# Patient Record
Sex: Male | Born: 1975 | Race: White | Hispanic: No | Marital: Single | Smoking: Current every day smoker
Health system: Southern US, Community
[De-identification: ages and names within clinical notes are randomized; demographics above are authoritative.]

---

## 2004-12-06 ENCOUNTER — Emergency Department (HOSPITAL_COMMUNITY): Admission: EM | Admit: 2004-12-06 | Discharge: 2004-12-06 | Payer: Self-pay | Admitting: Emergency Medicine

## 2004-12-09 ENCOUNTER — Emergency Department (HOSPITAL_COMMUNITY): Admission: EM | Admit: 2004-12-09 | Discharge: 2004-12-09 | Payer: Self-pay | Admitting: Emergency Medicine

## 2007-11-26 ENCOUNTER — Emergency Department (HOSPITAL_COMMUNITY): Admission: EM | Admit: 2007-11-26 | Discharge: 2007-11-26 | Payer: Self-pay | Admitting: Emergency Medicine

## 2008-05-03 ENCOUNTER — Emergency Department (HOSPITAL_COMMUNITY): Admission: EM | Admit: 2008-05-03 | Discharge: 2008-05-03 | Payer: Self-pay | Admitting: Emergency Medicine

## 2008-11-04 ENCOUNTER — Emergency Department (HOSPITAL_COMMUNITY): Admission: EM | Admit: 2008-11-04 | Discharge: 2008-11-04 | Payer: Self-pay | Admitting: Emergency Medicine

## 2009-03-04 ENCOUNTER — Emergency Department (HOSPITAL_COMMUNITY): Admission: EM | Admit: 2009-03-04 | Discharge: 2009-03-04 | Payer: Self-pay | Admitting: Emergency Medicine

## 2009-03-04 ENCOUNTER — Inpatient Hospital Stay (HOSPITAL_COMMUNITY): Admission: AD | Admit: 2009-03-04 | Discharge: 2009-03-08 | Payer: Self-pay | Admitting: Psychiatry

## 2009-03-04 ENCOUNTER — Ambulatory Visit: Payer: Self-pay | Admitting: Psychiatry

## 2009-06-17 IMAGING — CR DG FINGER THUMB 2+V*R*
3 series · 3 of 3 positions shown · non-contrast
Comparison: None

CLINICAL DATA: Injury to right thumb.

RIGHT THUMB 2+V

[x finger obl. right]
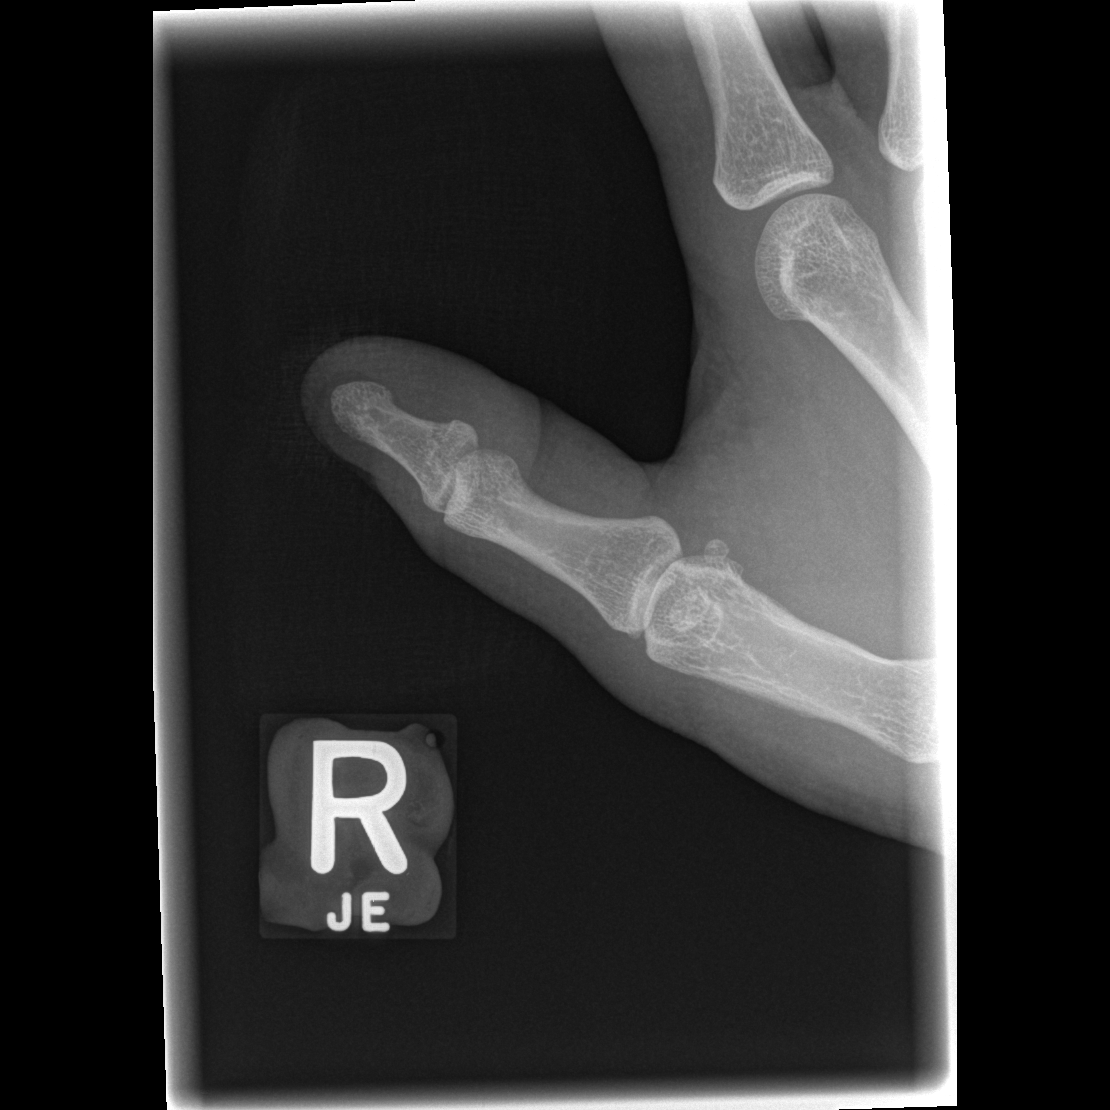

[x finger lateral right]
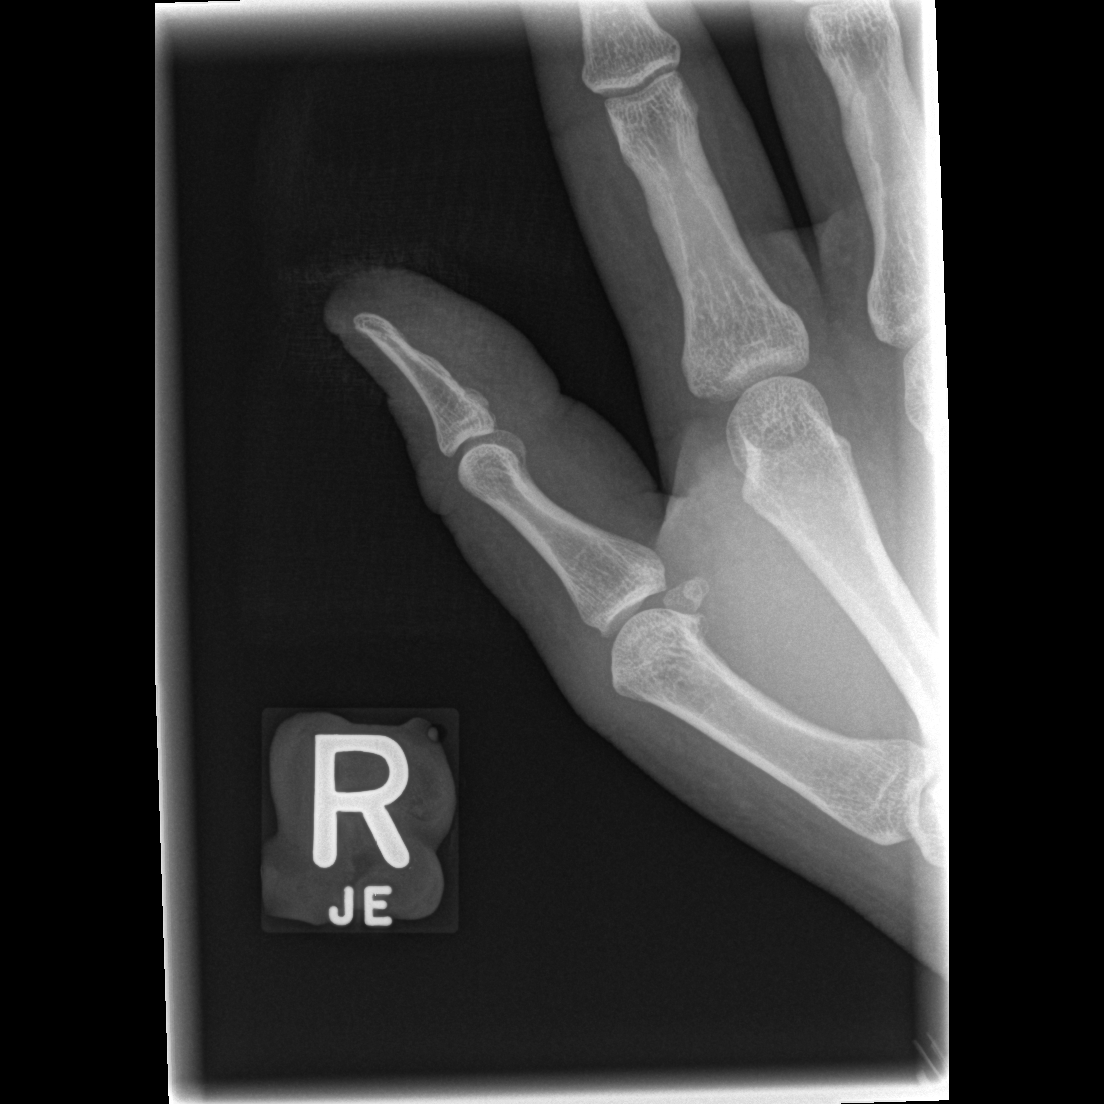

[x finger pa right]
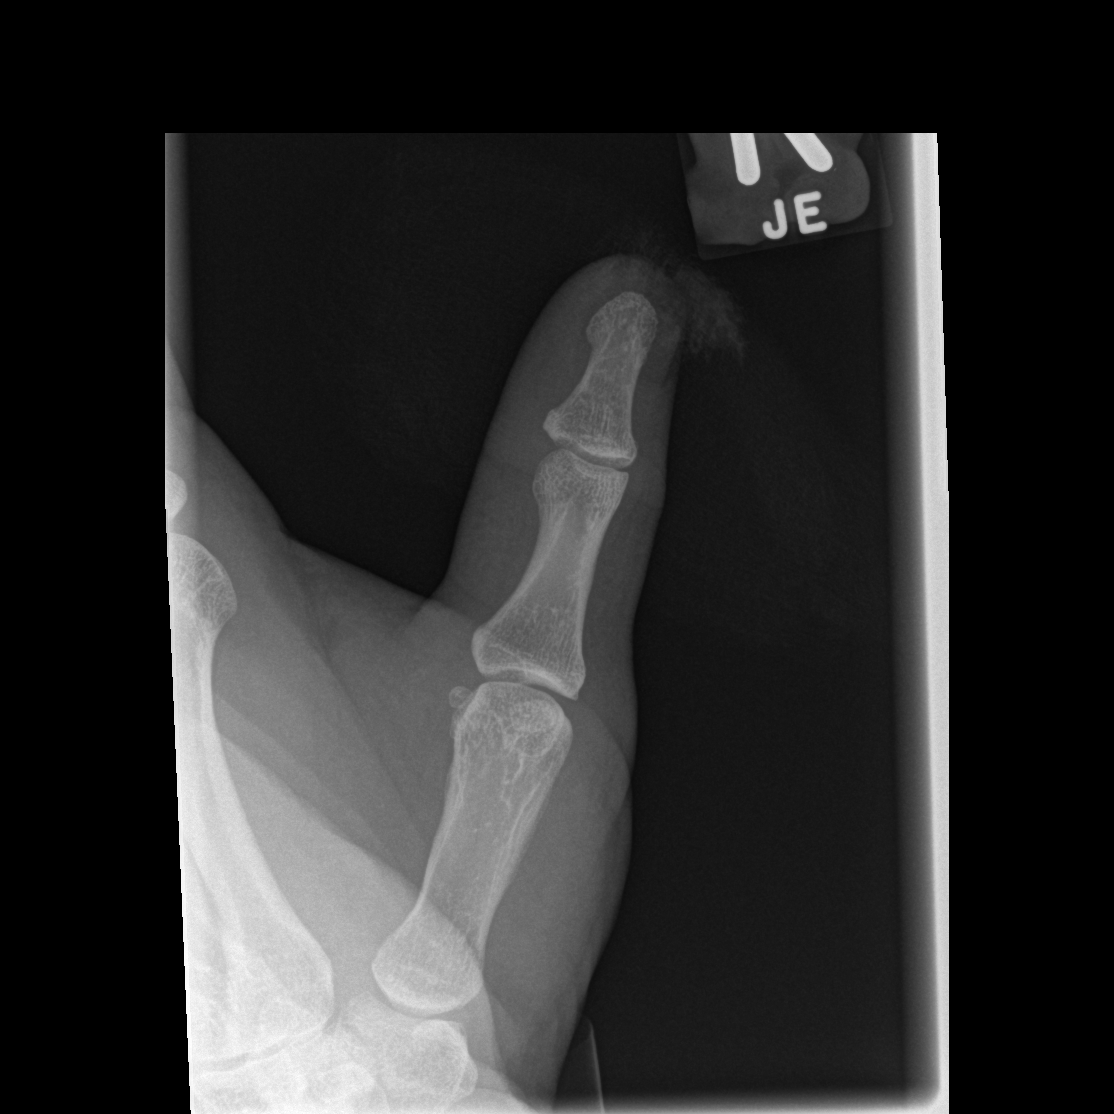

[3 of 3 positions shown; findings below may reference images not displayed]

FINDINGS: There is no evidence of acute bony abnormality.
There is no evidence of fracture, subluxation, or dislocation.
Soft tissue injury of the distal thumb tip is noted.
IMPRESSION: No evidence of acute bony abnormality.

## 2009-10-17 ENCOUNTER — Emergency Department (HOSPITAL_COMMUNITY): Admission: EM | Admit: 2009-10-17 | Discharge: 2009-10-18 | Payer: Self-pay | Admitting: Emergency Medicine

## 2009-10-18 ENCOUNTER — Ambulatory Visit: Payer: Self-pay | Admitting: Psychiatry

## 2009-10-18 ENCOUNTER — Inpatient Hospital Stay (HOSPITAL_COMMUNITY): Admission: EM | Admit: 2009-10-18 | Discharge: 2009-10-19 | Payer: Self-pay | Admitting: Psychiatry

## 2010-01-13 ENCOUNTER — Emergency Department (HOSPITAL_COMMUNITY): Admission: EM | Admit: 2010-01-13 | Discharge: 2010-01-14 | Payer: Self-pay | Admitting: Emergency Medicine

## 2010-04-15 ENCOUNTER — Emergency Department (HOSPITAL_COMMUNITY): Admission: EM | Admit: 2010-04-15 | Discharge: 2010-04-16 | Payer: Self-pay | Admitting: Family Medicine

## 2010-08-26 IMAGING — CR DG CHEST 2V
2 series · 2 of 2 positions shown · non-contrast
Comparison: 05/02/2008

CLINICAL DATA: Chest pain

CHEST - 2 VIEW

[w chest pa]
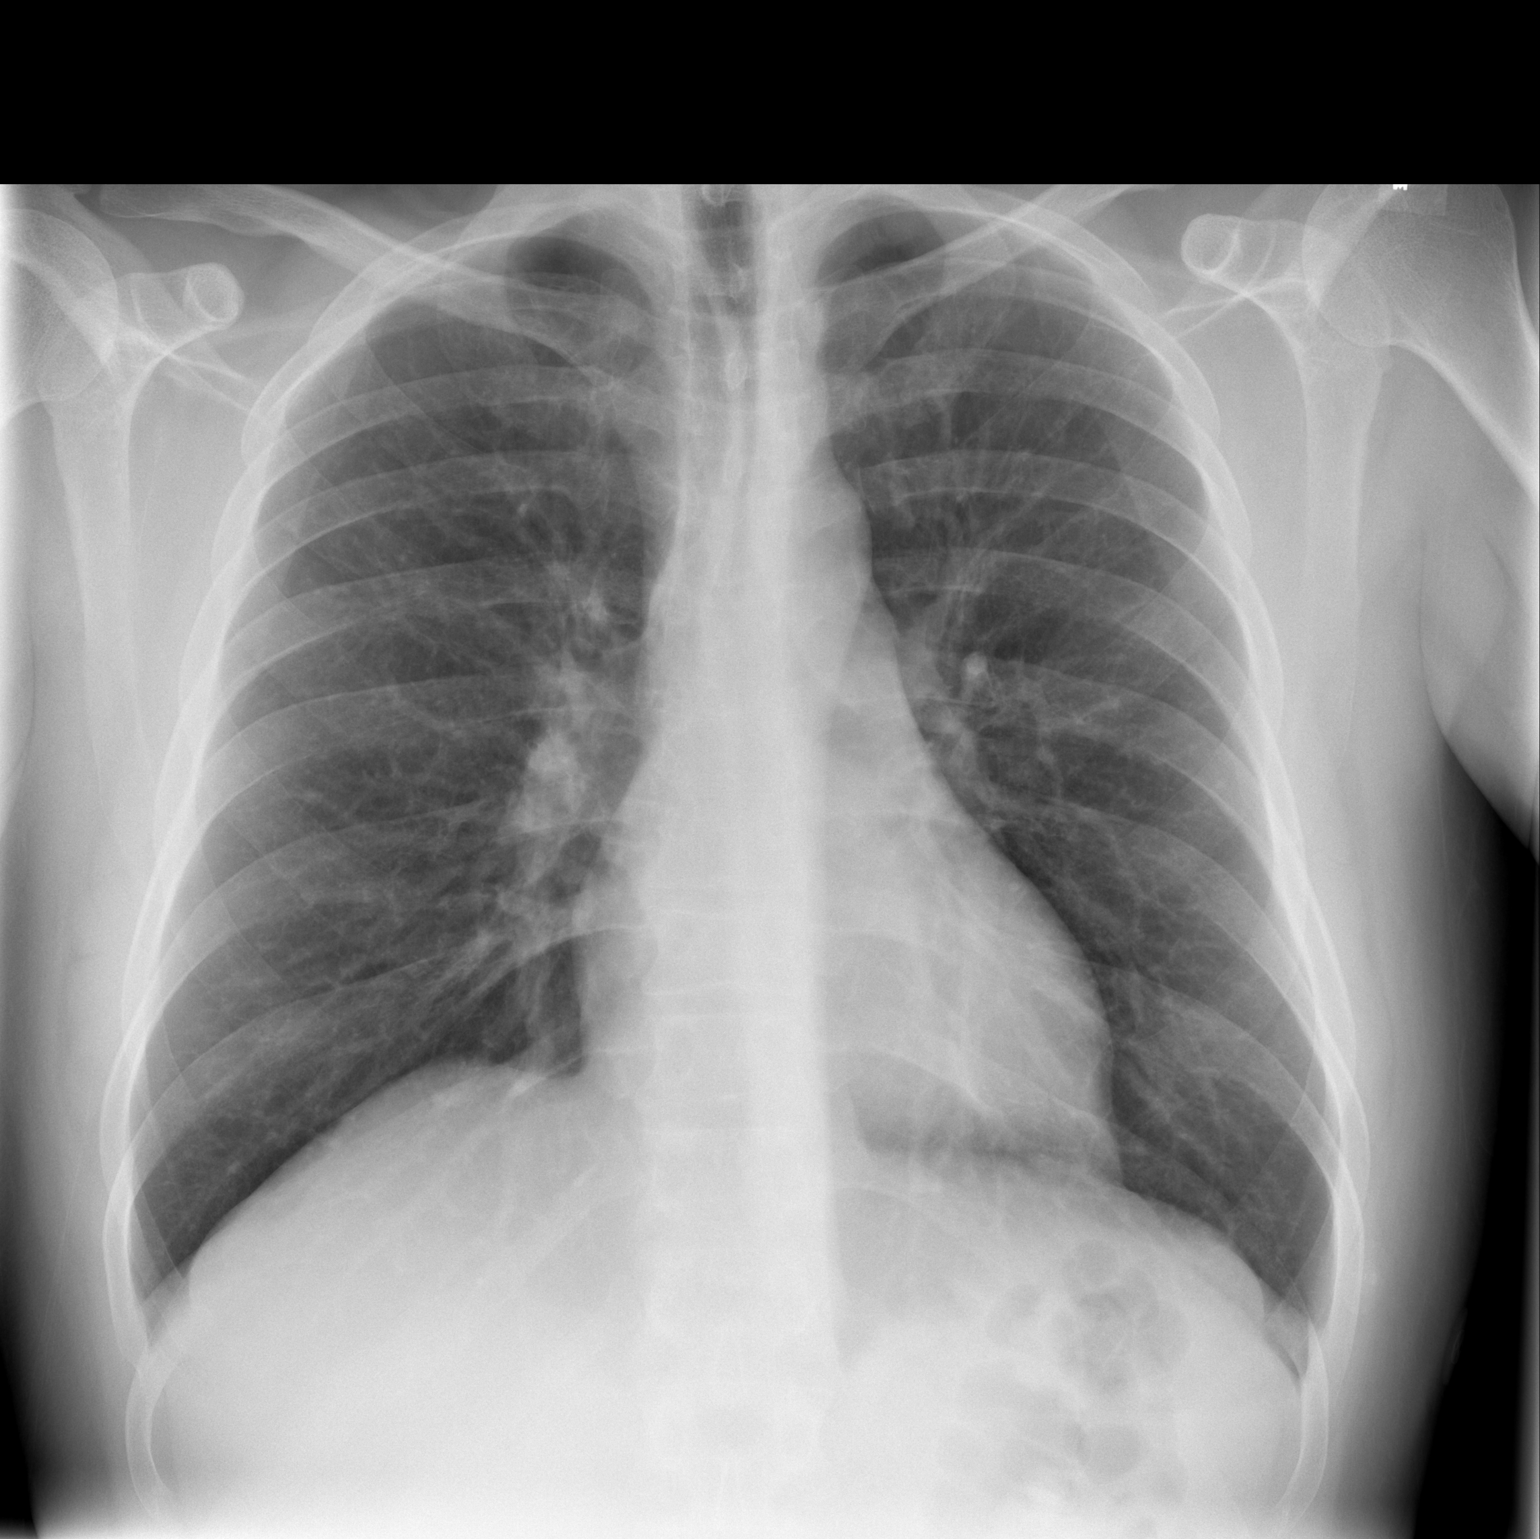

[w chest lat]
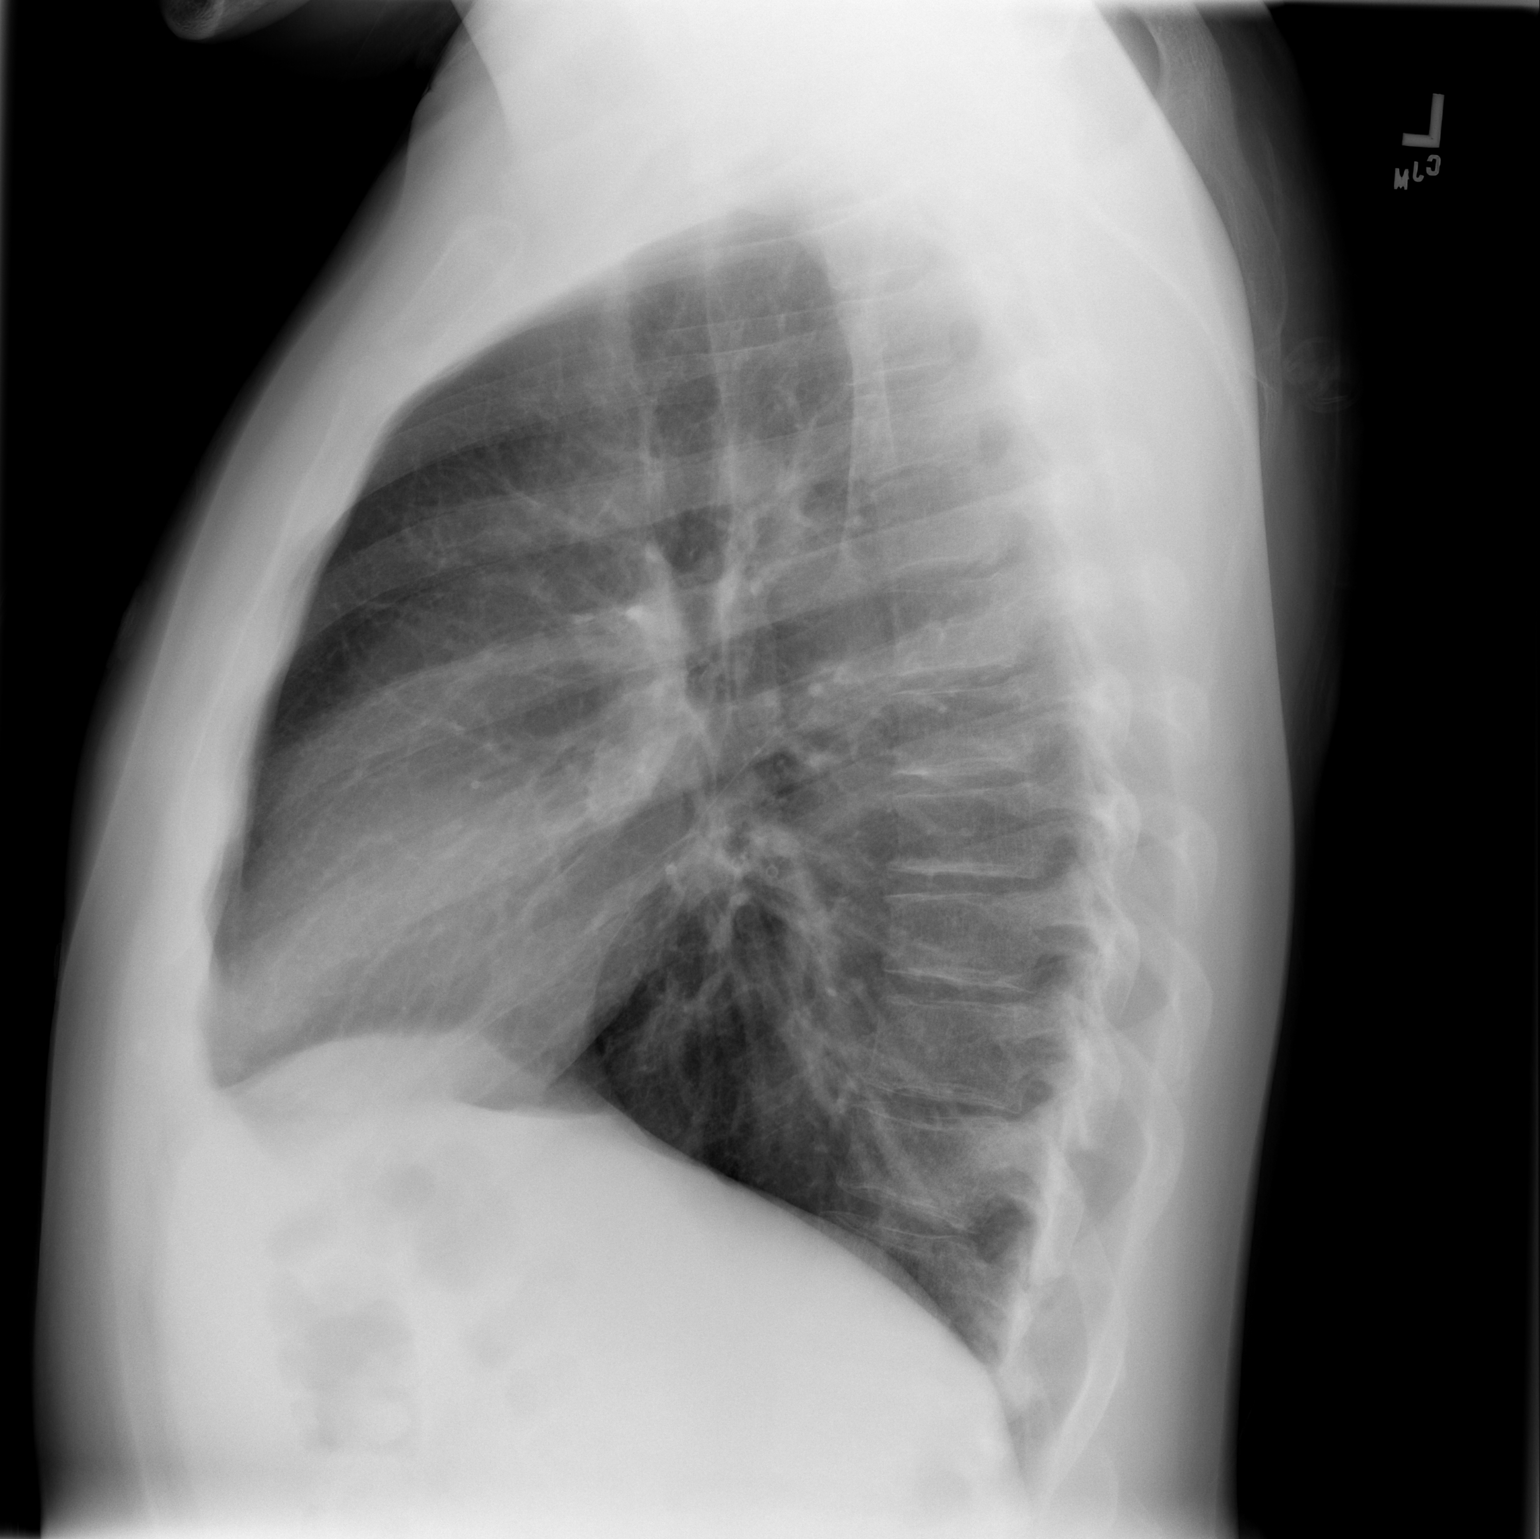

[2 of 2 positions shown; findings below may reference images not displayed]

FINDINGS: The heart size and mediastinal contours are within normal
limits.  Both lungs are clear.  The visualized skeletal structures
are unremarkable.
IMPRESSION: No acute disease.

## 2010-12-18 ENCOUNTER — Emergency Department (HOSPITAL_COMMUNITY)
Admission: EM | Admit: 2010-12-18 | Discharge: 2010-12-18 | Payer: Self-pay | Source: Home / Self Care | Admitting: Emergency Medicine

## 2011-02-21 LAB — RAPID URINE DRUG SCREEN, HOSP PERFORMED
Amphetamines: NOT DETECTED
Benzodiazepines: NOT DETECTED
Tetrahydrocannabinol: POSITIVE — AB

## 2011-02-21 LAB — URINE CULTURE: Colony Count: NO GROWTH

## 2011-02-21 LAB — BASIC METABOLIC PANEL
Chloride: 108 mEq/L (ref 96–112)
GFR calc Af Amer: >60 mL/min (ref >60)
GFR calc non Af Amer: >60 mL/min (ref >60)
Glucose, Bld: 111 mg/dL — ABNORMAL HIGH (ref 70–99)
Sodium: 142 mEq/L (ref 135–145)

## 2011-02-21 LAB — URINALYSIS, ROUTINE W REFLEX MICROSCOPIC
Glucose, UA: NEGATIVE mg/dL
Ketones, ur: NEGATIVE mg/dL
pH: 6.5 (ref 5.0–8.0)

## 2011-02-21 LAB — DIFFERENTIAL
Basophils Absolute: 0.1 10*3/uL (ref 0.0–0.1)
Eosinophils Absolute: 0.7 10*3/uL (ref 0.0–0.7)
Lymphocytes Relative: 29 % (ref 12–46)
Monocytes Absolute: 0.8 10*3/uL (ref 0.1–1.0)
Monocytes Relative: 8 % (ref 3–12)
Neutrophils Relative %: 55 % (ref 43–77)

## 2011-02-21 LAB — ETHANOL: Alcohol, Ethyl (B): <5 mg/dL (ref 0–10)

## 2011-02-21 LAB — URINE MICROSCOPIC-ADD ON

## 2011-02-21 LAB — CBC
HCT: 39.5 % (ref 39.0–52.0)
RDW: 17.4 % — ABNORMAL HIGH (ref 11.5–15.5)

## 2011-02-22 LAB — D-DIMER, QUANTITATIVE: D-Dimer, Quant: <0.22 ug/mL-FEU (ref 0.00–0.48)

## 2011-02-22 LAB — POCT I-STAT, CHEM 8
Calcium, Ion: 1.14 mmol/L (ref 1.12–1.32)
Glucose, Bld: 113 mg/dL — ABNORMAL HIGH (ref 70–99)
Potassium: 3.3 mEq/L — ABNORMAL LOW (ref 3.5–5.1)
Sodium: 140 mEq/L (ref 135–145)

## 2011-02-22 LAB — POCT CARDIAC MARKERS: Troponin i, poc: <0.05 ng/mL (ref 0.00–0.09)

## 2011-03-08 LAB — URINALYSIS, ROUTINE W REFLEX MICROSCOPIC
Glucose, UA: NEGATIVE mg/dL
Hgb urine dipstick: NEGATIVE
Nitrite: NEGATIVE
pH: 8 (ref 5.0–8.0)

## 2011-03-08 LAB — BASIC METABOLIC PANEL
Calcium: 9 mg/dL (ref 8.4–10.5)
Creatinine, Ser: 1 mg/dL (ref 0.4–1.5)
GFR calc non Af Amer: >60 mL/min (ref >60)
Potassium: 3.5 mEq/L (ref 3.5–5.1)

## 2011-03-08 LAB — RAPID URINE DRUG SCREEN, HOSP PERFORMED: Opiates: NOT DETECTED

## 2011-03-08 LAB — URINE CULTURE

## 2011-03-08 LAB — CBC
MCV: 85.8 fL (ref 78.0–100.0)
RBC: 4.57 MIL/uL (ref 4.22–5.81)
RDW: 16.4 % — ABNORMAL HIGH (ref 11.5–15.5)
WBC: 9.7 10*3/uL (ref 4.0–10.5)

## 2011-03-08 LAB — URINE MICROSCOPIC-ADD ON

## 2011-03-08 LAB — ETHANOL: Alcohol, Ethyl (B): <5 mg/dL (ref 0–10)

## 2011-03-15 LAB — COMPREHENSIVE METABOLIC PANEL
ALT: 13 U/L (ref 0–53)
AST: 19 U/L (ref 0–37)
Alkaline Phosphatase: 78 U/L (ref 39–117)
CO2: 40 mEq/L — ABNORMAL HIGH (ref 19–32)
Chloride: 90 mEq/L — ABNORMAL LOW (ref 96–112)
GFR calc non Af Amer: >60 mL/min (ref >60)
Glucose, Bld: 119 mg/dL — ABNORMAL HIGH (ref 70–99)
Potassium: 2.7 mEq/L — CL (ref 3.5–5.1)
Sodium: 139 mEq/L (ref 135–145)

## 2011-03-15 LAB — RAPID URINE DRUG SCREEN, HOSP PERFORMED
Amphetamines: NOT DETECTED
Barbiturates: NOT DETECTED

## 2011-03-15 LAB — CBC
HCT: 38.5 % — ABNORMAL LOW (ref 39.0–52.0)
Hemoglobin: 12.8 g/dL — ABNORMAL LOW (ref 13.0–17.0)
RBC: 4.34 MIL/uL (ref 4.22–5.81)
RDW: 14.3 % (ref 11.5–15.5)

## 2011-03-15 LAB — URINE MICROSCOPIC-ADD ON

## 2011-03-15 LAB — BASIC METABOLIC PANEL
GFR calc Af Amer: >60 mL/min (ref >60)
GFR calc non Af Amer: >60 mL/min (ref >60)
Glucose, Bld: 121 mg/dL — ABNORMAL HIGH (ref 70–99)
Potassium: 2.9 mEq/L — ABNORMAL LOW (ref 3.5–5.1)
Sodium: 134 mEq/L — ABNORMAL LOW (ref 135–145)

## 2011-03-15 LAB — URINALYSIS, ROUTINE W REFLEX MICROSCOPIC: Glucose, UA: NEGATIVE mg/dL

## 2011-03-15 LAB — DIFFERENTIAL
Eosinophils Relative: 3 % (ref 0–5)
Lymphocytes Relative: 30 % (ref 12–46)
Lymphs Abs: 2.6 10*3/uL (ref 0.7–4.0)
Monocytes Absolute: 1 10*3/uL (ref 0.1–1.0)
Monocytes Relative: 11 % (ref 3–12)

## 2011-03-15 LAB — RPR: RPR Ser Ql: NONREACTIVE

## 2011-04-21 NOTE — Discharge Summary (Signed)
NAMEGAL, FELDHAUS NO.:  [id number]   MEDICAL RECORD NO.:  [id number]          PATIENT TYPE:  IPS   LOCATION:  0506                          FACILITY:  [facility]   PHYSICIAN:  Geoffery Lyons, M.D.      DATE OF BIRTH:  11/06/1976   DATE OF ADMISSION:  03/04/2009  DATE OF DISCHARGE:  03/08/2009                               DISCHARGE SUMMARY   CHIEF COMPLAINT AND PRESENT ILLNESS:  This was the first admission to  [facility] for this 35 year old male presented with 1-  week cocaine binge, also drinking alcohol and marijuana.  Tried to kill  himself with a large lot of cocaine, unable to find work for 3 months, a  lot of arguments with wife.  Family life disrupted, separated from wife  and children, now homeless.  Not taking medication.   PAST PSYCHIATRIC HISTORY:  No prior suicide attempts.  Started drinking  alcohol at age 82.  Has been 6 months abstinent in the past.  Has been  in [place] in the past as well as [employer] at [employer].  Has  been diagnosed with a mood disorder.  Was on Depakote before.   ALCOHOL AND DRUG HISTORY:  As already stated persistent use of alcohol,  cocaine, marijuana.   MEDICAL HISTORY:  1. Pyuria.  2. Hypokalemia.   MEDICATIONS:  1. Rocephin IM ED for pyuria.  2. No regular medications.   PHYSICAL EXAMINATION:  Failed to show any acute findings.   LABORATORY WORKUP:  Potassium 2.7 replaced to 3.4.  TSH 1.803.  Depakote  level 61.9.  RPR nonreactive.   MENTAL STATUS EXAM:  Reveals a fully alert cooperative male.  Admits to  suicidal intent by overdosing on cocaine, depressed about life and his  situation, his separation.  Thought processes are logical, coherent, and  relevant with suicidal ruminations with a plan.  No intent at this  particular time.  No homicidal ideas, no delusions.  No hallucinations.  Cognition well-preserved.   ADMITTING DIAGNOSES:  Axis I:  Alcohol, cocaine dependence.  Mood  disorder not otherwise specified.  Axis II:  No diagnosis.  Axis III:  Hypokalemia corrected.  Pyuria.  Axis IV:  Moderate.  Axis V:  Upon admission 34, highest global assessment of functioning in  the last year is 60.   COURSE IN THE HOSPITAL:  Was admitted.  Was started in individual and  group psychotherapy.  He had been in the past on Depakote, Neurontin,  amitriptyline, Prilosec, and doxycycline which we detoxed with Ativan.  Gave him some Ambien.  Eventually he was placed back on the Depakote and  the Elavil.  As already stated a 35 year old male who endorsed he needs  help.  He relapsed on cocaine binge.  Spent thousands of dollars.  Claims stress of a friend dying and finding out his sponsor was using.  Admits to several rehabs in the past.  Has been abstinent up to 6-7  months.  Says after this relapse he is going to lose his wife, his  family; very upset about this situation.  Wanted to get himself back  together.  Did insist in going back on Depakote and Neurontin and Elavil  as he felt that combination really helped him.  April 4 was keeping  himself in his room.  Endorsed he was having a very hard time wanting to  use.  We resumed the Depakote and the Elavil.  By April 5 he was turning  around.  He had been sleeping better.  He had tolerated the medication.  Had an appropriate Depakote level as already stated, 61.99.  The family  was willing to help while waiting to go to [facility], so he was discharged to  stay with his parents until a bed in [facility] became available.  No active  suicidal or homicidal ideation.   DISCHARGE DIAGNOSES:  Axis I:  Alcohol and cocaine dependent.  Mood  disorder not otherwise specified.  Axis II:  No diagnosis.  Axis III:  Pyuria.  Hypokalemia, repleted.  Axis IV:  Moderate.  Axis V:  Upon discharge 49.   Discharged on:   1. Depakote 500 twice a day and 1000 at bedtime.  2. Amitriptyline 100 mg at night.  3. Neurontin 300 mg in the morning and at  bedtime.  4. Prilosec 40 mg per day.  5. Doxycycline 100 mg twice a day.   FOLLOWUP:  At the [facility] and [facility].      Geoffery Lyons, M.D.  Electronically Signed     IL/MEDQ  D:  04/01/2009  T:  04/01/2009  Job:  [id number]

## 2011-08-30 LAB — COMPREHENSIVE METABOLIC PANEL
ALT: 18
CO2: 30
Calcium: 9.5
Creatinine, Ser: 1.12
GFR calc Af Amer: >60
GFR calc non Af Amer: >60
Glucose, Bld: 118 — ABNORMAL HIGH
Sodium: 141
Total Protein: 7.2

## 2011-08-30 LAB — POCT I-STAT 3, ART BLOOD GAS (G3+)
Bicarbonate: 30.4 — ABNORMAL HIGH
Operator id: 270221
TCO2: 32
pCO2 arterial: 46 — ABNORMAL HIGH
pH, Arterial: 7.428
pO2, Arterial: 68 — ABNORMAL LOW

## 2011-08-30 LAB — RAPID URINE DRUG SCREEN, HOSP PERFORMED
Benzodiazepines: NOT DETECTED
Cocaine: POSITIVE — AB
Opiates: NOT DETECTED
Tetrahydrocannabinol: POSITIVE — AB

## 2011-08-30 LAB — CBC
Hemoglobin: 14
MCHC: 33.3
MCV: 90.3
RDW: 13.9

## 2011-08-30 LAB — DIFFERENTIAL
Eosinophils Absolute: 0.8 — ABNORMAL HIGH
Lymphocytes Relative: 33
Lymphs Abs: 3.1
Monocytes Relative: 12
Neutrophils Relative %: 46

## 2011-08-30 LAB — ACETAMINOPHEN LEVEL: Acetaminophen (Tylenol), Serum: <10 — ABNORMAL LOW

## 2011-09-08 LAB — RAPID STREP SCREEN (MED CTR MEBANE ONLY): Streptococcus, Group A Screen (Direct): POSITIVE — AB

## 2016-06-28 ENCOUNTER — Emergency Department (INDEPENDENT_AMBULATORY_CARE_PROVIDER_SITE_OTHER): Payer: BLUE CROSS/BLUE SHIELD

## 2016-06-28 ENCOUNTER — Emergency Department (INDEPENDENT_AMBULATORY_CARE_PROVIDER_SITE_OTHER)
Admission: EM | Admit: 2016-06-28 | Discharge: 2016-06-28 | Disposition: A | Discharge status: Home / Self Care | Payer: BLUE CROSS/BLUE SHIELD | Source: Home / Self Care | Attending: Family Medicine | Admitting: Family Medicine

## 2016-06-28 ENCOUNTER — Encounter: Payer: Self-pay | Admitting: Emergency Medicine

## 2016-06-28 DIAGNOSIS — M79642 Pain in left hand: Secondary | ICD-10-CM

## 2016-06-28 DIAGNOSIS — M7989 Other specified soft tissue disorders: Secondary | ICD-10-CM

## 2016-06-28 DIAGNOSIS — M25542 Pain in joints of left hand: Secondary | ICD-10-CM

## 2016-06-28 LAB — POCT CBC W AUTO DIFF (K'VILLE URGENT CARE)

## 2016-06-28 MED ORDER — PREDNISONE 20 MG PO TABS: ORAL_TABLET | ORAL | 0 refills | Status: AC

## 2016-06-28 MED ORDER — CEPHALEXIN 500 MG PO CAPS: 500.0000 mg | ORAL_CAPSULE | Freq: Three times a day (TID) | ORAL | 0 refills | Status: AC

## 2016-06-28 MED ORDER — HYDROCODONE-ACETAMINOPHEN 5-325 MG PO TABS: ORAL_TABLET | ORAL | 0 refills | Status: AC

## 2016-06-28 NOTE — Discharge Instructions (Signed)
Elevate hand whenever possible.

## 2016-06-28 NOTE — ED Triage Notes (Signed)
One week ago Left hand starting swelling and being painful, denies trauma. 9/10

## 2016-06-28 NOTE — ED Provider Notes (Signed)
[facility]    CSN: [id number] Arrival date & time: 06/28/16  1514  First Provider Contact:  First MD Initiated Contact with Patient 06/28/16 1636        History   Chief Complaint Chief Complaint  Patient presents with  . Hand Pain    HPI Jose Sims is a 40 y.o. male.   About one week ago patient developed pain in the palmar surface of his left hand.  He works in [employer] but recalls no injury.  He gradually developed increasing pain/swelling in the dorsum of his left hand over his MCP joints, and has had difficulty grasping.  He believes that he may have had a low grade fever yesterday.  No known insect bites.  No past history of gout, and no family history of gout.  The pain awakens him at night.   The history is provided by the patient.  Hand Pain  This is a new problem. Episode onset: 1 week ago. The problem occurs constantly. The problem has been gradually worsening. Associated symptoms comments: Low grade fever yesterday. Exacerbated by: grasping. Nothing relieves the symptoms. He has tried nothing for the symptoms.    History reviewed. No pertinent past medical history.  There are no active problems to display for this patient.   History reviewed. No pertinent surgical history.     Home Medications    Prior to Admission medications   Medication Sig Start Date End Date Taking? Authorizing Provider  omeprazole (PRILOSEC) 20 MG capsule Take 20 mg by mouth daily.   Yes Historical Provider, MD  cephALEXin (KEFLEX) 500 MG capsule Take 1 capsule (500 mg total) by mouth 3 (three) times daily. 06/28/16   Lattie Haw, MD  HYDROcodone-acetaminophen (NORCO/VICODIN) 5-325 MG tablet Take one tab by mouth twice daily as needed for pain  (Take one at bedtime). 06/28/16   Lattie Haw, MD  predniSONE (DELTASONE) 20 MG tablet Take one tab by mouth twice daily for 5 days, then one daily. Take with food. 06/28/16   Lattie Haw, MD    Family History Family  History  Problem Relation Age of Onset  . Asthma Mother     Social History Social History  Substance Use Topics  . Smoking status: Current Every Day Smoker    Packs/day: 1.00    Years: 25.00    Types: Cigarettes  . Smokeless tobacco: Never Used  . Alcohol use Yes     Allergies   Tramadol   Review of Systems Review of Systems  All other systems reviewed and are negative.    Physical Exam Triage Vital Signs ED Triage Vitals  Enc Vitals Group     BP 06/28/16 1601 137/86     Pulse Rate 06/28/16 1601 73     Resp --      Temp 06/28/16 1601 97.9 F (36.6 C)     Temp Source 06/28/16 1601 Oral     SpO2 06/28/16 1601 99 %     Weight 06/28/16 1601 267 lb (121.1 kg)     Height 06/28/16 1601 6' (1.829 m)     Head Circumference --      Peak Flow --      Pain Score 06/28/16 1604 9     Pain Loc --      Pain Edu? --      Excl. in GC? --    No data found.   Updated Vital Signs BP 137/86 (BP Location: Left Arm)  Pulse 73   Temp 97.9 F (36.6 C) (Oral)   Ht 6' (1.829 m)   Wt 267 lb (121.1 kg)   SpO2 99%   BMI 36.21 kg/m   Visual Acuity Right Eye Distance:   Left Eye Distance:   Bilateral Distance:    Right Eye Near:   Left Eye Near:    Bilateral Near:     Physical Exam  Constitutional: He is oriented to person, place, and time. He appears well-developed and well-nourished. No distress.  HENT:  Head: Normocephalic.  Mouth/Throat: Oropharynx is clear and moist.  Eyes: EOM are normal. Pupils are equal, round, and reactive to light.  Cardiovascular: Normal heart sounds.   Pulmonary/Chest: Breath sounds normal.  Musculoskeletal: He exhibits tenderness.       Left hand: He exhibits decreased range of motion, tenderness, bony tenderness and swelling. He exhibits normal two-point discrimination, normal capillary refill, no deformity and no laceration.       Hands: There is tenderness to palpation, swelling, and warmth over the dorsum of left hand, with maximal  tenderness over MCP joints.  No erythema.  No wounds present.  Distal neurovascular function is intact.   Lymphadenopathy:    He has no cervical adenopathy.  Neurological: He is oriented to person, place, and time.  Skin: Skin is warm and dry.  Nursing note and vitals reviewed.    UC Treatments / Results  Labs (all labs ordered are listed, but only abnormal results are displayed) Labs Reviewed  URIC ACID  POCT CBC W AUTO DIFF ([facility])    EKG  EKG Interpretation None       Radiology Dg Hand Complete Left  Result Date: 06/28/2016 CLINICAL DATA:  Left hand pain and swelling. No known injury. Symptoms of unknown duration. Initial encounter. EXAM: LEFT HAND - COMPLETE 3+ VIEW COMPARISON:  None. FINDINGS: Soft tissues about the hand are swollen. No radiopaque foreign body or soft tissue gas is seen. Image bones and joints appear normal. IMPRESSION: Soft tissue swelling.  Otherwise negative. Electronically Signed   By: Drusilla Kanner M.D.   On: 06/28/2016 16:59   Procedures Procedures (including critical care time)  Medications Ordered in UC Medications - No data to display   Initial Impression / Assessment and Plan / UC Course  I have reviewed the triage vital signs and the nursing notes.  Pertinent labs & imaging results that were available during my care of the patient were reviewed by me and considered in my medical decision making (see chart for details).  Clinical Course       Final Clinical Impressions(s) / UC Diagnoses   Final diagnoses:  Swelling of left hand  Pain in joints of left hand   Suspect acute gout; Uric acid pending.  Begin prednisone burst/taper. Normal WBC 9.5 reassuring; however, will begin antibiotic coverage for cellulitis with Keflex. Rx for Lortab for pain at night (patient notes no adverse effects from hydrocodone). Elevate hand whenever possible. Followup with PCP if not improved one week.   New Prescriptions New  Prescriptions   CEPHALEXIN (KEFLEX) 500 MG CAPSULE    Take 1 capsule (500 mg total) by mouth 3 (three) times daily.   HYDROCODONE-ACETAMINOPHEN (NORCO/VICODIN) 5-325 MG TABLET    Take one tab by mouth twice daily as needed for pain  (Take one at bedtime).   PREDNISONE (DELTASONE) 20 MG TABLET    Take one tab by mouth twice daily for 5 days, then one daily. Take with food.  Lattie Haw, MD 06/28/16 [phone]

## 2016-06-29 LAB — URIC ACID: Uric Acid, Serum: 3.9 mg/dL — ABNORMAL LOW (ref 4.0–8.0)

## 2016-06-30 ENCOUNTER — Telehealth: Payer: Self-pay | Admitting: *Deleted

## 2016-06-30 NOTE — Telephone Encounter (Signed)
Spoke to pt given lab results. He reports that his hand is some better. He will f/u with sports med on Monday if he fails to improve. Clemens Catholic, LPN

## 2016-07-14 ENCOUNTER — Emergency Department (INDEPENDENT_AMBULATORY_CARE_PROVIDER_SITE_OTHER)
Admission: EM | Admit: 2016-07-14 | Discharge: 2016-07-14 | Disposition: A | Discharge status: Home / Self Care | Payer: BLUE CROSS/BLUE SHIELD | Source: Home / Self Care | Attending: Family Medicine | Admitting: Family Medicine

## 2016-07-14 ENCOUNTER — Encounter: Payer: Self-pay | Admitting: Emergency Medicine

## 2016-07-14 DIAGNOSIS — M79671 Pain in right foot: Secondary | ICD-10-CM | POA: Diagnosis not present

## 2016-07-14 MED ORDER — COLCHICINE 0.6 MG PO TABS: ORAL_TABLET | ORAL | 0 refills | Status: AC

## 2016-07-14 MED ORDER — INDOMETHACIN 25 MG PO CAPS: 25.0000 mg | ORAL_CAPSULE | Freq: Three times a day (TID) | ORAL | 0 refills | Status: AC | PRN

## 2016-07-14 MED ORDER — PREDNISONE 20 MG PO TABS: ORAL_TABLET | ORAL | 0 refills | Status: AC

## 2016-07-14 NOTE — ED Triage Notes (Signed)
Pt c/o right foot pain that started suddenly this am. Denies injury. Mild swelling

## 2016-07-14 NOTE — ED Provider Notes (Signed)
CSN: [id number]     Arrival date & time 07/14/16  1759 History   First MD Initiated Contact with Patient 07/14/16 1813     Chief Complaint  Patient presents with  . Foot Pain   (Consider location/radiation/quality/duration/timing/severity/associated sxs/prior Treatment) HPI  Diamond NickelBrian D Stallworth is a 40 y.o. male presenting to UC with c/o sudden onset, suddenly worsening Right foot pain that started earlier this morning while pt was at work. Pt denies known injury. Pain is aching and throbbing, 9/10, worse with weight bearing and ambulation.  He reports hx of similar pain with swelling and redness in his Left hand about 2-3 weeks ago.  He states his uric acid test came back normal but he was still advised he likely had gout or possible cellulitis.  He was treated with vicodin, prednisone and antibiotics. Symptoms resolved completely in Left hand so he never f/u with PCP or orthopedist. Denies fever, chills, n/v/d.    History reviewed. No pertinent past medical history. History reviewed. No pertinent surgical history. Family History  Problem Relation Age of Onset  . Asthma Mother    Social History  Substance Use Topics  . Smoking status: Current Every Day Smoker    Packs/day: 1.00    Years: 25.00    Types: Cigarettes  . Smokeless tobacco: Never Used  . Alcohol use Yes    Review of Systems  Constitutional: Negative for chills and fever.  Musculoskeletal: Positive for arthralgias, gait problem, joint swelling and myalgias.       Right foot  Skin: Negative for color change, rash and wound.  Neurological: Negative for weakness and numbness.    Allergies  Tramadol  Home Medications   Prior to Admission medications   Medication Sig Start Date End Date Taking? Authorizing Provider  cephALEXin (KEFLEX) 500 MG capsule Take 1 capsule (500 mg total) by mouth 3 (three) times daily. 06/28/16   Lattie HawStephen A Beese, MD  colchicine 0.6 MG tablet Take 2 tabs orally, followed by 1 tab in 1 hour. May  take 1 tab daily for total of 3 days 07/14/16   Junius FinnerErin O'Malley, PA-C  HYDROcodone-acetaminophen (NORCO/VICODIN) 5-325 MG tablet Take one tab by mouth twice daily as needed for pain  (Take one at bedtime). 06/28/16   Lattie HawStephen A Beese, MD  indomethacin (INDOCIN) 25 MG capsule Take 1 capsule (25 mg total) by mouth 3 (three) times daily as needed. 07/14/16   Junius FinnerErin O'Malley, PA-C  omeprazole (PRILOSEC) 20 MG capsule Take 20 mg by mouth daily.    Historical Provider, MD  predniSONE (DELTASONE) 20 MG tablet Take one tab by mouth twice daily for 5 days, then one daily. Take with food. 06/28/16   Lattie HawStephen A Beese, MD  predniSONE (DELTASONE) 20 MG tablet 3 tabs po day one, then 2 po daily x 4 days 07/14/16   Junius FinnerErin O'Malley, PA-C   Meds Ordered and Administered this Visit  Medications - No data to display  BP 129/84 (BP Location: Right Arm)   Pulse 80   Temp 98.3 F (36.8 C)   Wt 264 lb (119.7 kg)   SpO2 98%   BMI 35.80 kg/m  No data found.   Physical Exam  Constitutional: He is oriented to person, place, and time. He appears well-developed and well-nourished.  HENT:  Head: Normocephalic and atraumatic.  Eyes: EOM are normal.  Neck: Normal range of motion.  Cardiovascular: Normal rate.   Pulmonary/Chest: Effort normal.  Musculoskeletal: Normal range of motion. He exhibits edema and tenderness.  Right foot: minimal edema, diffuse tenderness, worse along 1st metatarsal.  Full ROM ankle and toes.  Calf is soft, non-tender.  Neurological: He is alert and oriented to person, place, and time.  Skin: Skin is warm and dry. Capillary refill takes less than 2 seconds. No erythema.  Right foot: skin in tact, no ecchymosis or erythema.   Psychiatric: He has a normal mood and affect. His behavior is normal.  Nursing note and vitals reviewed.   Urgent Care Course   Clinical Course    Procedures (including critical care time)  Labs Review Labs Reviewed - No data to display  Imaging Review No results  found.    MDM   1. Foot pain, right    Pt c/o sudden onset Right foot pain and swelling w/o known injury.  PMS in tact. No evidence of underlying infection. No indication for imaging at this time. Due to similar symptoms in Left hand about 2-3 weeks ago, will treat as gout.  Rx: indomethacin, colchicine, and prednisone.   Encouraged f/u with Sports Medicine next week for recheck of symptoms. [facility] medical records [facility], pt has hx of cocaine withdrawal as recent as 02/15/2016. Hesitant to give narcotic pain medications at this time.  Pt was agreeable with treatment plan discussed today.     Junius Finner, PA-C 07/14/16 1839
